# Patient Record
Sex: Male | Born: 1985 | Race: Black or African American | Hispanic: No | Marital: Single | Smoking: Current every day smoker
Health system: Southern US, Community
[De-identification: ages and names within clinical notes are randomized; demographics above are authoritative.]

---

## 2011-05-18 ENCOUNTER — Emergency Department (HOSPITAL_COMMUNITY)
Admission: EM | Admit: 2011-05-18 | Discharge: 2011-05-18 | Disposition: A | Discharge status: Home / Self Care | Payer: Worker's Compensation | Attending: Emergency Medicine | Admitting: Emergency Medicine

## 2011-05-18 ENCOUNTER — Emergency Department (HOSPITAL_COMMUNITY): Payer: Worker's Compensation

## 2011-05-18 DIAGNOSIS — M549 Dorsalgia, unspecified: Secondary | ICD-10-CM | POA: Insufficient documentation

## 2011-05-18 DIAGNOSIS — R109 Unspecified abdominal pain: Secondary | ICD-10-CM | POA: Insufficient documentation

## 2011-05-18 DIAGNOSIS — IMO0002 Reserved for concepts with insufficient information to code with codable children: Secondary | ICD-10-CM | POA: Insufficient documentation

## 2011-05-18 DIAGNOSIS — S5000XA Contusion of unspecified elbow, initial encounter: Secondary | ICD-10-CM | POA: Insufficient documentation

## 2011-05-18 DIAGNOSIS — M25529 Pain in unspecified elbow: Secondary | ICD-10-CM | POA: Insufficient documentation

## 2011-05-18 DIAGNOSIS — W11XXXA Fall on and from ladder, initial encounter: Secondary | ICD-10-CM | POA: Insufficient documentation

## 2011-05-18 LAB — URINALYSIS, ROUTINE W REFLEX MICROSCOPIC
Bilirubin Urine: NEGATIVE
Glucose, UA: NEGATIVE mg/dL
Hgb urine dipstick: NEGATIVE
Ketones, ur: NEGATIVE mg/dL
Leukocytes, UA: NEGATIVE
Nitrite: NEGATIVE
Protein, ur: NEGATIVE mg/dL
Specific Gravity, Urine: 1.019 (ref 1.005–1.030)
Urobilinogen, UA: 1 mg/dL (ref 0.0–1.0)
pH: 6.5 (ref 5.0–8.0)

## 2012-05-18 IMAGING — CR DG RIBS W/ CHEST 3+V*L*
5 series · 5 of 5 positions shown · non-contrast
Comparison: None.

CLINICAL DATA: Pain post fall

LEFT RIBS AND CHEST - 3+ VIEW

[w chest pa]
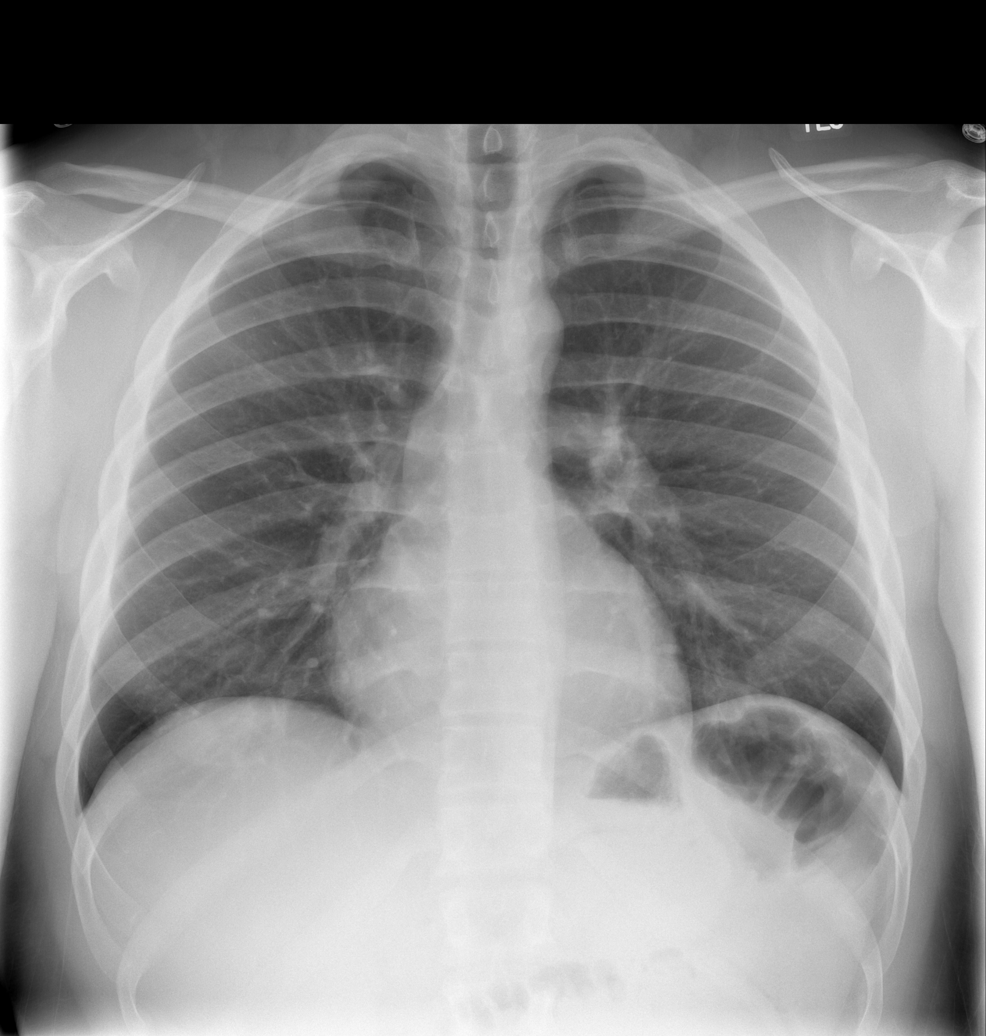

[w ribs ap/pa upper left]
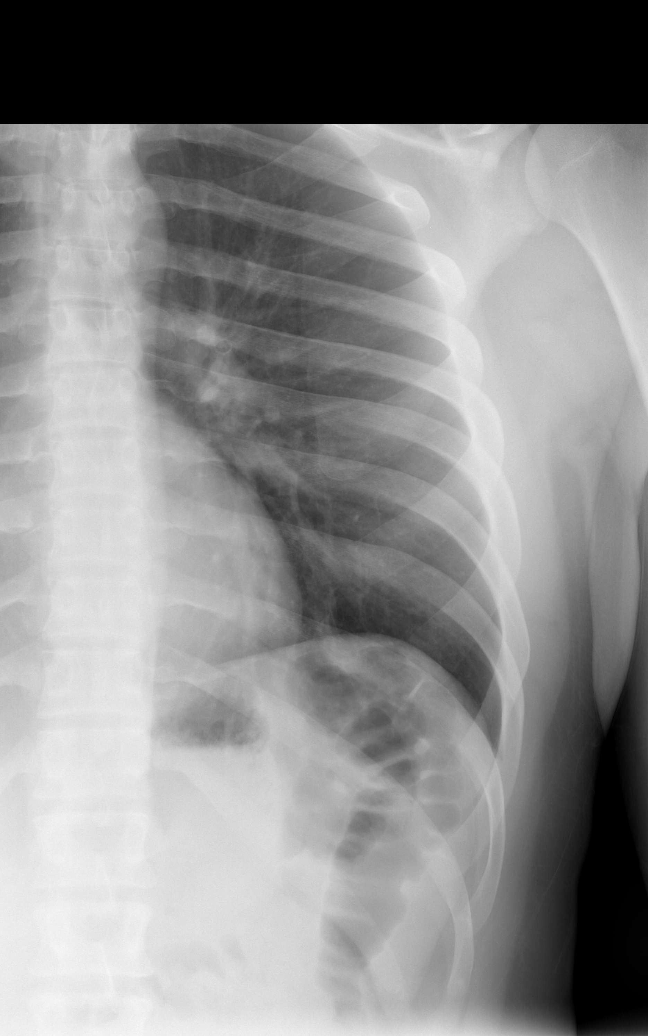

[w ribs ap/pa lower left]
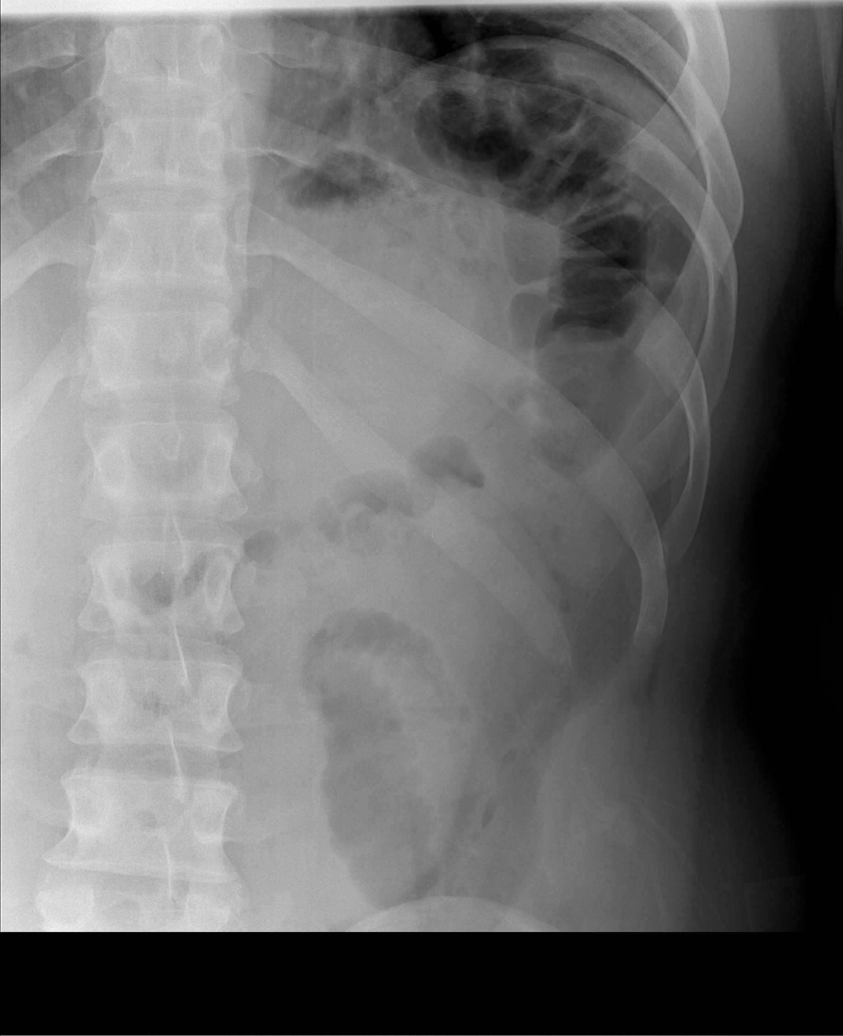

[w ribs oblique left (1 of 2)]
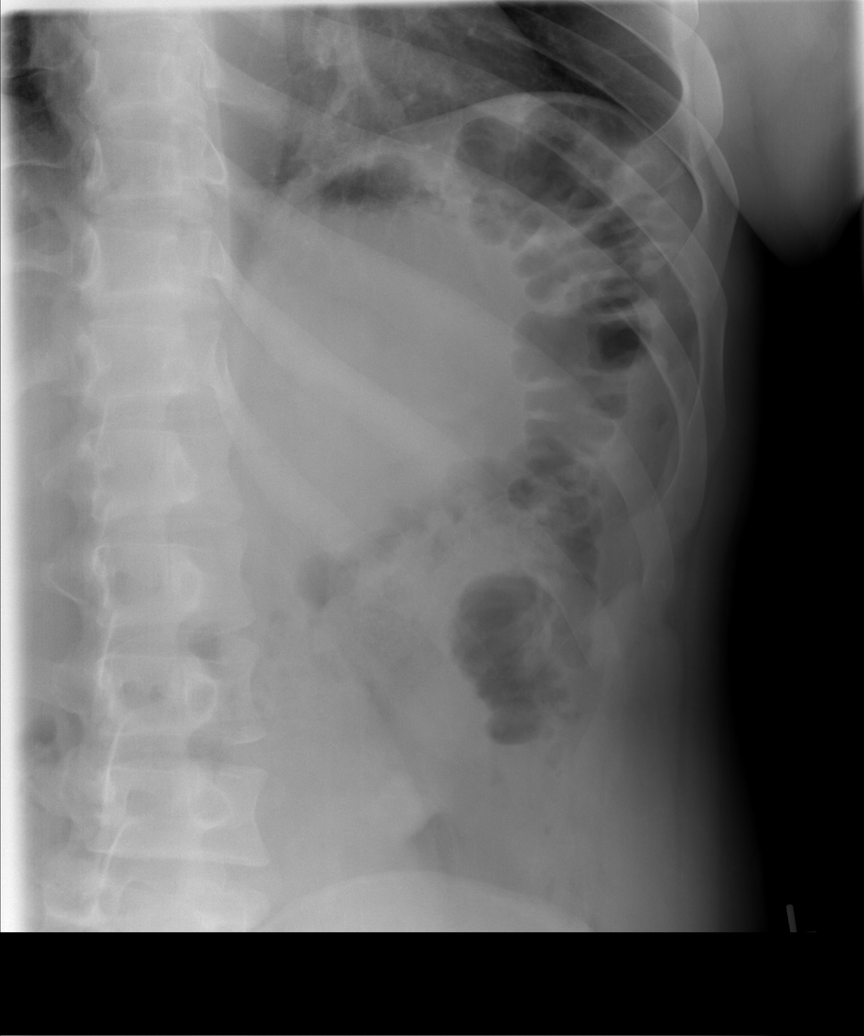

[w ribs oblique left (2 of 2)]
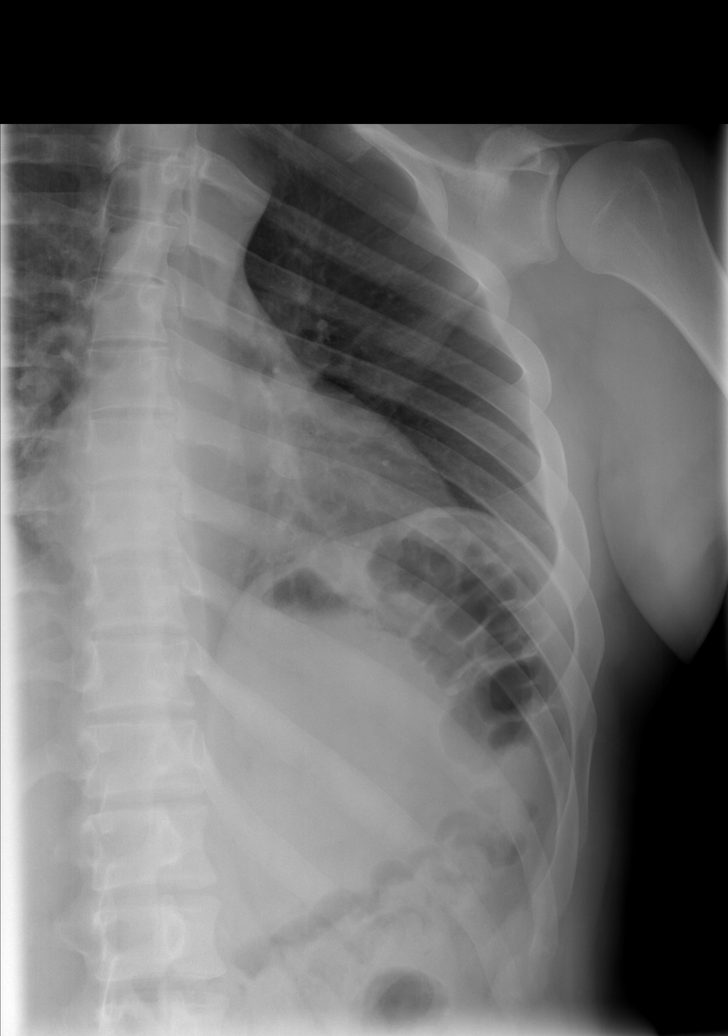

[5 of 5 positions shown; findings below may reference images not displayed]

FINDINGS: No pneumothorax or effusion.  Lungs clear.  Heart size
normal.  4 detailed views of the left ribs show no displaced
fracture or other focal lesion.
IMPRESSION: 1.  Negative

## 2014-02-16 ENCOUNTER — Emergency Department (HOSPITAL_COMMUNITY)
Admission: EM | Admit: 2014-02-16 | Discharge: 2014-02-16 | Disposition: A | Discharge status: Home / Self Care | Payer: BC Managed Care – PPO | Attending: Emergency Medicine | Admitting: Emergency Medicine

## 2014-02-16 ENCOUNTER — Encounter (HOSPITAL_COMMUNITY): Payer: Self-pay | Admitting: Emergency Medicine

## 2014-02-16 DIAGNOSIS — S61401A Unspecified open wound of right hand, initial encounter: Secondary | ICD-10-CM

## 2014-02-16 DIAGNOSIS — Y93G3 Activity, cooking and baking: Secondary | ICD-10-CM | POA: Insufficient documentation

## 2014-02-16 DIAGNOSIS — S61209A Unspecified open wound of unspecified finger without damage to nail, initial encounter: Secondary | ICD-10-CM | POA: Insufficient documentation

## 2014-02-16 DIAGNOSIS — W260XXA Contact with knife, initial encounter: Secondary | ICD-10-CM | POA: Insufficient documentation

## 2014-02-16 DIAGNOSIS — Y929 Unspecified place or not applicable: Secondary | ICD-10-CM | POA: Insufficient documentation

## 2014-02-16 DIAGNOSIS — W261XXA Contact with sword or dagger, initial encounter: Secondary | ICD-10-CM

## 2014-02-16 MED ORDER — HYDROCODONE-ACETAMINOPHEN 5-325 MG PO TABS: 1.0000 | ORAL_TABLET | Freq: Four times a day (QID) | ORAL | Status: AC | PRN

## 2014-02-16 MED ORDER — IBUPROFEN 800 MG PO TABS: 800.0000 mg | ORAL_TABLET | Freq: Three times a day (TID) | ORAL | Status: AC | PRN
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
## 2014-02-16 NOTE — ED Provider Notes (Signed)
CSN: [id number]     Arrival date & time 02/16/14  1933 History   First MD Initiated Contact with Patient 02/16/14 1949     This chart was scribed for non-physician practitioner, Ebbie Ridgehris Genevive Printup PA-C working with Ward GivensIva L Knapp, MD by Arlan OrganAshley Leger, ED Scribe. This patient was seen in room TR08C/TR08C and the patient's care was started at 8:07 PM.   Chief Complaint  Patient presents with  . Finger Injury   The history is provided by the patient. No language interpreter was used.    HPI Comments: Collin Taylor is a 28 y.o. male who presents to the Emergency Department complaining of a finger injury to the R distal 5th digit sustained just prior to arrival. Pt states he was cooking and cut his finger with a sharp knife. He has not applied anything to the wound since onset of injury. At this time he denies any fever, chills, loss of sensation, or numbness. States his Tetanus shot is UTD. Pt has no pertinent medical history. No other concerns this visit.  No past medical history on file. No past surgical history on file. No family history on file. History  Substance Use Topics  . Smoking status: Not on file  . Smokeless tobacco: Not on file  . Alcohol Use: Not on file    Review of Systems  Constitutional: Negative for fever and chills.  HENT: Negative for congestion.   Eyes: Negative for redness.  Respiratory: Negative for cough.   Skin: Positive for wound.  Neurological: Negative for numbness.  Psychiatric/Behavioral: Negative for confusion.      Allergies  Review of patient's allergies indicates not on file.  Home Medications   Prior to Admission medications   Not on File   Triage Vitals: BP 135/84  Pulse 61  Temp(Src) 99.3 F (37.4 C) (Oral)  Resp 14  Ht 5\' 10"  (1.778 m)  Wt 233 lb (105.688 kg)  BMI 33.43 kg/m2  SpO2 98%   Physical Exam  Nursing note and vitals reviewed. Constitutional: He is oriented to person, place, and time. He appears well-developed and  well-nourished.  HENT:  Head: Normocephalic and atraumatic.  Eyes: EOM are normal.  Neck: Normal range of motion.  Cardiovascular: Normal rate.   Pulmonary/Chest: Effort normal.  Musculoskeletal: Normal range of motion.       Hands: Neurological: He is alert and oriented to person, place, and time.  Skin: Skin is warm and dry.  Psychiatric: He has a normal mood and affect. His behavior is normal.    ED Course  Cauterization Date/Time: 02/16/2014 10:16 PM Performed by: Carlyle DollyLAWYER, Naoma Boxell W Authorized by: Carlyle DollyLAWYER, Kenzee Bassin W Consent: Verbal consent obtained. Risks and benefits: risks, benefits and alternatives were discussed Consent given by: patient Patient understanding: patient states understanding of the procedure being performed Patient consent: the patient's understanding of the procedure matches consent given Procedure consent: procedure consent matches procedure scheduled Relevant documents: relevant documents present and verified Patient identity confirmed: verbally with patient Time out: Immediately prior to procedure a "time out" was called to verify the correct patient, procedure, equipment, support staff and site/side marked as required. Preparation: Patient was prepped and draped in the usual sterile fashion. Local anesthesia used: yes Anesthesia: digital block Local anesthetic: lidocaine 2% without epinephrine Patient sedated: no Patient tolerance: Patient tolerated the procedure well with no immediate complications. Comments: Also placed a layer of Dermabond over top of the cauterized areas   (including critical care time)  DIAGNOSTIC STUDIES: Oxygen Saturation is 98%  on RA, Normal by my interpretation.    COORDINATION OF CARE: 8:11 PM-Discussed treatment plan with pt at bedside and pt agreed to plan.     Patient has an avulsion injury and I used.  The cautery to stop the bleeding areas.  Also, placed a layered Dermabond over the top of the wound.  Patient  is advised to return here as needed.  Told the areas will take a while to heal since the skin was avulsed.  Patient is advised return here as needed.    I personally performed the services described in this documentation, which was scribed in my presence. The recorded information has been reviewed and is accurate.    Carlyle Dollyhristopher W Eisa Conaway, PA-C 02/16/14 2217
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
## 2014-02-16 NOTE — Discharge Instructions (Signed)
Return here as needed.  Keep the area covered while working

## 2014-02-16 NOTE — ED Provider Notes (Signed)
Medical screening examination/treatment/procedure(s) were performed by non-physician practitioner and as supervising physician I was immediately available for consultation/collaboration.   EKG Interpretation None      Devoria AlbeIva Colbie Sliker, MD, Armando GangFACEP   Ward GivensIva L Bellany Elbaum, MD 02/16/14 2225
